# Patient Record
Sex: Male | Born: 1965 | Race: White | Hispanic: No | Marital: Single | Smoking: Never smoker
Health system: Southern US, Community
[De-identification: ages and names within clinical notes are randomized; demographics above are authoritative.]

## PROBLEM LIST (undated history)

## (undated) DIAGNOSIS — Z8739 Personal history of other diseases of the musculoskeletal system and connective tissue: Secondary | ICD-10-CM

## (undated) DIAGNOSIS — K229 Disease of esophagus, unspecified: Secondary | ICD-10-CM

## (undated) DIAGNOSIS — K219 Gastro-esophageal reflux disease without esophagitis: Secondary | ICD-10-CM

## (undated) DIAGNOSIS — E785 Hyperlipidemia, unspecified: Secondary | ICD-10-CM

## (undated) HISTORY — PX: KNEE ARTHROSCOPY W/ ACL RECONSTRUCTION: SHX1858

## (undated) HISTORY — PX: CHOLECYSTECTOMY: SHX55

---

## 2017-12-13 ENCOUNTER — Other Ambulatory Visit: Payer: Self-pay

## 2017-12-13 ENCOUNTER — Emergency Department (INDEPENDENT_AMBULATORY_CARE_PROVIDER_SITE_OTHER)
Admission: EM | Admit: 2017-12-13 | Discharge: 2017-12-13 | Disposition: A | Discharge status: Home / Self Care | Payer: PRIVATE HEALTH INSURANCE | Source: Home / Self Care | Attending: Family Medicine | Admitting: Family Medicine

## 2017-12-13 ENCOUNTER — Encounter: Payer: Self-pay | Admitting: Emergency Medicine

## 2017-12-13 DIAGNOSIS — R002 Palpitations: Secondary | ICD-10-CM

## 2017-12-13 DIAGNOSIS — E162 Hypoglycemia, unspecified: Secondary | ICD-10-CM | POA: Diagnosis not present

## 2017-12-13 DIAGNOSIS — R202 Paresthesia of skin: Secondary | ICD-10-CM

## 2017-12-13 HISTORY — DX: Gastro-esophageal reflux disease without esophagitis: K21.9

## 2017-12-13 HISTORY — DX: Personal history of other diseases of the musculoskeletal system and connective tissue: Z87.39

## 2017-12-13 HISTORY — DX: Hyperlipidemia, unspecified: E78.5

## 2017-12-13 HISTORY — DX: Disease of esophagus, unspecified: K22.9

## 2017-12-13 LAB — CBC WITH DIFFERENTIAL/PLATELET
Basophils Absolute: 33 cells/uL (ref 0–200)
Basophils Relative: 0.5 %
Eosinophils Absolute: 211 cells/uL (ref 15–500)
Eosinophils Relative: 3.2 %
HCT: 43.2 % (ref 38.5–50.0)
Hemoglobin: 14.8 g/dL (ref 13.2–17.1)
Lymphs Abs: 1228 cells/uL (ref 850–3900)
MCH: 31 pg (ref 27.0–33.0)
MCHC: 34.3 g/dL (ref 32.0–36.0)
MCV: 90.4 fL (ref 80.0–100.0)
MPV: 10.2 fL (ref 7.5–12.5)
Monocytes Relative: 7.4 %
Neutro Abs: 4640 cells/uL (ref 1500–7800)
Neutrophils Relative %: 70.3 %
Platelets: 274 10*3/uL (ref 140–400)
RBC: 4.78 10*6/uL (ref 4.20–5.80)
RDW: 12.4 % (ref 11.0–15.0)
Total Lymphocyte: 18.6 %
WBC mixed population: 488 cells/uL (ref 200–950)
WBC: 6.6 10*3/uL (ref 3.8–10.8)

## 2017-12-13 LAB — COMPLETE METABOLIC PANEL WITH GFR
AG Ratio: 2.1 (calc) (ref 1.0–2.5)
ALT: 22 U/L (ref 9–46)
AST: 14 U/L (ref 10–40)
Albumin: 4 g/dL (ref 3.6–5.1)
Alkaline phosphatase (APISO): 90 U/L (ref 40–115)
BUN: 15 mg/dL (ref 7–25)
CO2: 29 mmol/L (ref 20–32)
Calcium: 8.8 mg/dL (ref 8.6–10.3)
Chloride: 108 mmol/L (ref 98–110)
Creat: 1.03 mg/dL (ref 0.60–1.35)
GFR, Est African American: 98 mL/min/{1.73_m2} (ref >=60)
GFR, Est Non African American: 85 mL/min/{1.73_m2} (ref >=60)
Globulin: 1.9 g/dL (calc) (ref 1.9–3.7)
Glucose, Bld: 100 mg/dL — ABNORMAL HIGH (ref 65–99)
Potassium: 4.4 mmol/L (ref 3.5–5.3)
Sodium: 141 mmol/L (ref 135–146)
Total Bilirubin: 0.3 mg/dL (ref 0.2–1.2)
Total Protein: 5.9 g/dL — ABNORMAL LOW (ref 6.1–8.1)

## 2017-12-13 LAB — POCT FASTING CBG KUC MANUAL ENTRY: POCT Glucose (KUC): 107 mg/dL — AB (ref 70–99)

## 2017-12-13 NOTE — ED Provider Notes (Signed)
[facility]    CSN: [id number] Arrival date & time: 12/13/17  1509     History   Chief Complaint Chief Complaint  Patient presents with  . Tachycardia  . Tingling    lips  . Hypoglycemia    per [employer] [facility]    HPI James Martinez is a 52 y.o. male.   HPI  James Martinez is a 52 y.o. male presenting to UC with c/o episodes of lips tingling and burning with his heart racing and blurry vision over the last 1 month.  Today, he was at a health fair when his symptoms started so he had his blood sugar checked, it was 55.  He ate a large lunch and his symptoms resolved. He then went home to do some yardwork and had some tingling of his lips again.  He has hx of high cholesterol, dx 2 years ago during his last physical but was not started on medication at that time. No personal hx of thyroid disease or diabetes but pt states both run in his family.  He is currently looking for a new PCP but wanted to be evaluated today due to his low blood sugar reading.     Past Medical History:  Diagnosis Date  . Esophageal abnormality    spasm  . GERD (gastroesophageal reflux disease)   . History of knee problem   . Hyperlipidemia     There are no active problems to display for this patient.   Past Surgical History:  Procedure Laterality Date  . KNEE ARTHROSCOPY W/ ACL RECONSTRUCTION         Home Medications    Prior to Admission medications   Medication Sig Start Date End Date Taking? Authorizing Provider  aspirin 81 MG chewable tablet Chew by mouth daily.   Yes [provider]  omeprazole (PRILOSEC) 10 MG capsule Take 10 mg by mouth daily.   Yes [provider]    Family History No family history on file.  Social History Social History   Tobacco Use  . Smoking status: Never Smoker  . Smokeless tobacco: Never Used  Substance Use Topics  . Alcohol use: Never    Frequency: Never  . Drug use: Not on file     Allergies     Penicillins   Review of Systems Review of Systems  Constitutional: Negative for chills, diaphoresis and fever.  HENT: Negative for congestion, ear pain, sore throat, trouble swallowing and voice change.   Respiratory: Negative for cough and shortness of breath.   Cardiovascular: Positive for palpitations. Negative for chest pain.  Gastrointestinal: Negative for abdominal pain, diarrhea, nausea and vomiting.  Musculoskeletal: Negative for arthralgias, back pain and myalgias.  Skin: Negative for rash.  Neurological: Positive for numbness (tinginglin in lips).     Physical Exam Triage Vital Signs ED Triage Vitals [12/13/17 1554]  Enc Vitals Group     BP 117/75     Pulse Rate 70     Resp 18     Temp 98.8 F (37.1 C)     Temp Source Oral     SpO2 96 %     Weight 232 lb (105.2 kg)     Height  (1.676 m)     Head Circumference      Peak Flow      Pain Score 0     Pain Loc      Pain Edu?      Excl. in GC?  No data found.  Updated Vital Signs BP 117/75 (BP Location: Right Arm)   Pulse 70   Temp 98.8 F (37.1 C) (Oral)   Resp 18   Ht  (1.676 m)   Wt 232 lb (105.2 kg)   SpO2 96%   BMI 37.45 kg/m   Visual Acuity Right Eye Distance:   Left Eye Distance:   Bilateral Distance:    Right Eye Near:   Left Eye Near:    Bilateral Near:     Physical Exam  Constitutional: He is oriented to person, place, and time. He appears well-developed and well-nourished. No distress.  HENT:  Head: Normocephalic and atraumatic.  Mouth/Throat: Oropharynx is clear and moist.  Eyes: Pupils are equal, round, and reactive to light. Conjunctivae and EOM are normal.  Neck: Normal range of motion. Neck supple.  Cardiovascular: Normal rate and regular rhythm.  Pulmonary/Chest: Effort normal and breath sounds normal. No stridor. No respiratory distress. He has no wheezes. He has no rales.  Musculoskeletal: Normal range of motion.  Neurological: He is alert and oriented to person,  place, and time. No cranial nerve deficit. Coordination normal.  Skin: Skin is warm and dry. Capillary refill takes less than 2 seconds. No rash noted. He is not diaphoretic.  Psychiatric: He has a normal mood and affect. His behavior is normal.  Nursing note and vitals reviewed.    UC Treatments / Results  Labs (all labs ordered are listed, but only abnormal results are displayed) Labs Reviewed  POCT FASTING CBG [facility] MANUAL ENTRY - Abnormal; Notable for the following components:      Result Value   POCT Glucose ([facility]) 107 (*)    All other components within normal limits  COMPLETE METABOLIC PANEL WITH GFR  TSH  HEMOGLOBIN A1C  CBC WITH DIFFERENTIAL/PLATELET    EKG Date/Time:12/13/2017   15:40:28 Ventricular Rate: 63 PR Interval: 164 QRS Duration: 106 QT Interval: 400 QTC Calculation: 409 P-R-T axes: 42  25   28 Text Interpretation: Normal sinus rhythm, normal EKG    Radiology No results found.  Procedures Procedures (including critical care time)  Medications Ordered in UC Medications - No data to display   Initial Impression / Assessment and Plan / UC Course  I have reviewed the triage vital signs and the nursing notes.  Pertinent labs & imaging results that were available during my care of the patient were reviewed by me and considered in my medical decision making (see chart for details).     Reassured pt of normal EKG. CBG: 107 Symptoms likely due to pt's low blood glucose at that time, however, recommend pt re-establish care with a PCP for a full physical. Will start labs today: CBC, CMP, TSH, and HgbA1c Home care instructions provided  Discussed symptoms that warrant emergent care in the ED.   Final Clinical Impressions(s) / UC Diagnoses   Final diagnoses:  Hypoglycemia  Palpitations  Paresthesia    ED Discharge Orders    None       Controlled Substance Prescriptions [place] Controlled Substance Registry consulted? Not Applicable     Rolla Plate 12/13/17 1654

## 2017-12-13 NOTE — ED Triage Notes (Signed)
Patient reports episodes of racing heart, tingling of lips, blurred vision over past month; today had these symptoms and his BG at a health fair was 55; he had breakfast 2 hours prior to test.

## 2017-12-15 ENCOUNTER — Telehealth: Payer: Self-pay | Admitting: Emergency Medicine

## 2017-12-15 LAB — TSH: TSH: 0.53 mIU/L (ref 0.40–4.50)

## 2017-12-15 LAB — HEMOGLOBIN A1C
Hgb A1c MFr Bld: 5.1 % of total Hgb (ref <5.7)
Mean Plasma Glucose: 100 (calc)
eAG (mmol/L): 5.5 (calc)

## 2018-03-04 ENCOUNTER — Other Ambulatory Visit: Payer: Self-pay

## 2018-03-04 ENCOUNTER — Emergency Department
Admission: EM | Admit: 2018-03-04 | Discharge: 2018-03-04 | Disposition: A | Discharge status: Home / Self Care | Payer: PRIVATE HEALTH INSURANCE | Source: Home / Self Care | Attending: Family Medicine | Admitting: Family Medicine

## 2018-03-04 ENCOUNTER — Encounter: Payer: Self-pay | Admitting: Emergency Medicine

## 2018-03-04 DIAGNOSIS — J069 Acute upper respiratory infection, unspecified: Secondary | ICD-10-CM | POA: Diagnosis not present

## 2018-03-04 DIAGNOSIS — B9789 Other viral agents as the cause of diseases classified elsewhere: Secondary | ICD-10-CM | POA: Diagnosis not present

## 2018-03-04 MED ORDER — BENZONATATE 100 MG PO CAPS: 100.0000 mg | ORAL_CAPSULE | Freq: Three times a day (TID) | ORAL | 0 refills | Status: DC

## 2018-03-04 MED ORDER — PREDNISONE 20 MG PO TABS: ORAL_TABLET | ORAL | 0 refills | Status: DC

## 2018-03-04 NOTE — ED Triage Notes (Signed)
52 y.o male c/o productive cough that began yesterday. States he's been taking Ibuprofen and Promethazine with no relief.

## 2018-03-04 NOTE — Discharge Instructions (Signed)
°  Please take the tessalon and prednisone as prescribed and continue to use your inhaler as needed.  You may also take over the counter cough medication such as plain Mucinex, Robitussin, or Delsym to help with cough.  Please follow up with family medicine in 1 week if not improving, sooner if significantly worsening.

## 2018-03-04 NOTE — ED Provider Notes (Signed)
[facility]    CSN: [id number] Arrival date & time: 03/04/18  0808     History   Chief Complaint Chief Complaint  Patient presents with  . Cough    HPI Rondel Ohewell L Jayson is a 52 y.o. male.   HPI  Rondel Ohewell L Pumphrey is a 52 y.o. male presenting to UC with c/o moderately productive cough that started yesterday with associated nausea with 1 episode of post-tussive vomiting and mild chest soreness with HA. He did take ibuprofen and promethazine with mild relief. He has not tried any cough medication. He does have a hx of asthma and has used his inhaler more frequently yesterday.  Denies SOB or chest tightness. Denies fever, chills, or diarrhea.  No known sick contacts or recent travel.    Past Medical History:  Diagnosis Date  . Esophageal abnormality    spasm  . GERD (gastroesophageal reflux disease)   . History of knee problem   . Hyperlipidemia     There are no active problems to display for this patient.   Past Surgical History:  Procedure Laterality Date  . KNEE ARTHROSCOPY W/ ACL RECONSTRUCTION         Home Medications    Prior to Admission medications   Medication Sig Start Date End Date Taking? Authorizing Provider  aspirin 81 MG chewable tablet Chew by mouth daily.    [provider]  benzonatate (TESSALON) 100 MG capsule Take 1-2 capsules (100-200 mg total) by mouth every 8 (eight) hours. 03/04/18   Lurene ShadowPhelps, Tajia Szeliga O, PA-C  omeprazole (PRILOSEC) 10 MG capsule Take 10 mg by mouth daily.    [provider]  predniSONE (DELTASONE) 20 MG tablet 3 tabs po day one, then 2 po daily x 4 days 03/04/18   Lurene ShadowPhelps, Alsie Younes O, PA-C    Family History Family History  Problem Relation Age of Onset  . Cancer Father   . Heart failure Father     Social History Social History   Tobacco Use  . Smoking status: Never Smoker  . Smokeless tobacco: Never Used  Substance Use Topics  . Alcohol use: Never    Frequency: Never  . Drug use: Never      Allergies   Penicillins   Review of Systems Review of Systems  Constitutional: Negative for chills and fever.  HENT: Positive for congestion. Negative for ear pain, sore throat, trouble swallowing and voice change.   Respiratory: Positive for cough. Negative for shortness of breath.   Cardiovascular: Positive for chest pain (sore from cough). Negative for palpitations.  Gastrointestinal: Negative for abdominal pain, diarrhea, nausea and vomiting.  Musculoskeletal: Negative for arthralgias, back pain and myalgias.  Skin: Negative for rash.  Neurological: Positive for headaches. Negative for dizziness and light-headedness.     Physical Exam Triage Vital Signs ED Triage Vitals  Enc Vitals Group     BP 03/04/18 0826 119/82     Pulse Rate 03/04/18 0826 63     Resp --      Temp 03/04/18 0826 98.4 F (36.9 C)     Temp Source 03/04/18 0826 Oral     SpO2 03/04/18 0826 99 %     Weight 03/04/18 0827 235 lb (106.6 kg)     Height 03/04/18 0827 5\' 6"  (1.676 m)     Head Circumference --      Peak Flow --      Pain Score 03/04/18 0827 0     Pain Loc --  Pain Edu? --      Excl. in GC? --    No data found.  Updated Vital Signs BP 119/82 (BP Location: Right Arm)   Pulse 63   Temp 98.4 F (36.9 C) (Oral)   Ht 5\' 6"  (1.676 m)   Wt 235 lb (106.6 kg)   SpO2 99%   BMI 37.93 kg/m   Visual Acuity Right Eye Distance:   Left Eye Distance:   Bilateral Distance:    Right Eye Near:   Left Eye Near:    Bilateral Near:     Physical Exam  Constitutional: He is oriented to person, place, and time. He appears well-developed and well-nourished. No distress.  HENT:  Head: Normocephalic and atraumatic.  Right Ear: Tympanic membrane normal.  Left Ear: Tympanic membrane normal.  Nose: Nose normal. Right sinus exhibits no maxillary sinus tenderness and no frontal sinus tenderness. Left sinus exhibits no maxillary sinus tenderness and no frontal sinus tenderness.  Mouth/Throat:  Uvula is midline, oropharynx is clear and moist and mucous membranes are normal.  Eyes: EOM are normal.  Neck: Normal range of motion. Neck supple.  Cardiovascular: Normal rate and regular rhythm.  Pulmonary/Chest: Effort normal and breath sounds normal. No stridor. No respiratory distress. He has no wheezes. He has no rales.  Intermittent dry cough during exam. No respiratory distress.   Musculoskeletal: Normal range of motion.  Lymphadenopathy:    He has no cervical adenopathy.  Neurological: He is alert and oriented to person, place, and time.  Skin: Skin is warm and dry. He is not diaphoretic.  Psychiatric: He has a normal mood and affect. His behavior is normal.  Nursing note and vitals reviewed.    UC Treatments / Results  Labs (all labs ordered are listed, but only abnormal results are displayed) Labs Reviewed - No data to display  EKG None  Radiology No results found.  Procedures Procedures (including critical care time)  Medications Ordered in UC Medications - No data to display  Initial Impression / Assessment and Plan / UC Course  I have reviewed the triage vital signs and the nursing notes.  Pertinent labs & imaging results that were available during my care of the patient were reviewed by me and considered in my medical decision making (see chart for details).     Hx and exam c/w viral URI. Pt requesting work note. Note provided, pt to go back Friday.   Final Clinical Impressions(s) / UC Diagnoses   Final diagnoses:  Viral URI with cough     Discharge Instructions      Please take the tessalon and prednisone as prescribed and continue to use your inhaler as needed.  You may also take over the counter cough medication such as plain Mucinex, Robitussin, or Delsym to help with cough.  Please follow up with family medicine in 1 week if not improving, sooner if significantly worsening.     ED Prescriptions    Medication Sig Dispense Auth. Provider    benzonatate (TESSALON) 100 MG capsule Take 1-2 capsules (100-200 mg total) by mouth every 8 (eight) hours. 21 capsule Doroteo Glassman, Lanasia Porras O, PA-C   predniSONE (DELTASONE) 20 MG tablet 3 tabs po day one, then 2 po daily x 4 days 11 tablet Lurene Shadow, PA-C     Controlled Substance Prescriptions [place] Controlled Substance Registry consulted? Not Applicable   Rolla Plate 03/04/18 1610

## 2018-04-16 ENCOUNTER — Encounter: Payer: Self-pay | Admitting: Family Medicine

## 2018-04-16 ENCOUNTER — Emergency Department
Admission: EM | Admit: 2018-04-16 | Discharge: 2018-04-16 | Disposition: A | Discharge status: Home / Self Care | Payer: PRIVATE HEALTH INSURANCE | Source: Home / Self Care

## 2018-04-16 DIAGNOSIS — L03311 Cellulitis of abdominal wall: Secondary | ICD-10-CM | POA: Diagnosis not present

## 2018-04-16 MED ORDER — DOXYCYCLINE HYCLATE 100 MG PO TABS: 100.0000 mg | ORAL_TABLET | Freq: Two times a day (BID) | ORAL | 0 refills | Status: DC

## 2018-04-16 NOTE — ED Triage Notes (Signed)
James Martinez complains of open sore with redness and swelling it comes and goes for the last month. He has applied anti-fungal and also tried antibiotic to the area without relief.

## 2018-04-16 NOTE — ED Provider Notes (Signed)
[facility]    CSN: [id number] Arrival date & time: 04/16/18  1726     History   Chief Complaint Chief Complaint  Patient presents with  . Wound Check    HPI James Martinez is a 52 y.o. male.   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  He has had some surrounding erythema that is migrating to the right lower quadrant.  There is mild tenderness.  Patient has had no fever or chronic skin breakdown.     Past Medical History:  Diagnosis Date  . Esophageal abnormality    spasm  . GERD (gastroesophageal reflux disease)   . History of knee problem   . Hyperlipidemia     There are no active problems to display for this patient.   Past Surgical History:  Procedure Laterality Date  . KNEE ARTHROSCOPY W/ ACL RECONSTRUCTION         Home Medications    Prior to Admission medications   Medication Sig Start Date End Date Taking? Authorizing Provider  aspirin 81 MG chewable tablet Chew by mouth daily.   Yes [provider]  omeprazole (PRILOSEC) 10 MG capsule Take 10 mg by mouth daily.   Yes [provider]  doxycycline (VIBRA-TABS) 100 MG tablet Take 1 tablet (100 mg total) by mouth 2 (two) times daily. 04/16/18   Elvina Sidle, MD    Family History Family History  Problem Relation Age of Onset  . Cancer Father   . Heart failure Father     Social History Social History   Tobacco Use  . Smoking status: Never Smoker  . Smokeless tobacco: Never Used  Substance Use Topics  . Alcohol use: Never    Frequency: Never  . Drug use: Never     Allergies   Penicillins and Prednisone   Review of Systems Review of Systems  Constitutional: Negative.   Skin: Positive for rash.     Physical Exam Triage Vital Signs ED Triage Vitals  Enc Vitals Group     BP 04/16/18 1744 122/86     Pulse Rate 04/16/18 1744 83     Resp --      Temp 04/16/18 1744  99 F (37.2 C)     Temp Source 04/16/18 1744 Oral     SpO2 04/16/18 1744 95 %     Weight 04/16/18 1746 238 lb (108 kg)     Height 04/16/18 1746 5\' 6"  (1.676 m)     Head Circumference --      Peak Flow --      Pain Score 04/16/18 1745 4     Pain Loc --      Pain Edu? --      Excl. in GC? --    No data found.  Updated Vital Signs BP 122/86 (BP Location: Right Arm)   Pulse 83   Temp 99 F (37.2 C) (Oral)   Ht 5\' 6"  (1.676 m)   Wt 108 kg   SpO2 95%   BMI 38.41 kg/m    Physical Exam  Constitutional: He appears well-developed and well-nourished.  HENT:  Head: Normocephalic.  Eyes: Pupils are equal, round, and reactive to light. Conjunctivae are normal.  Neck: Normal range of motion. Neck supple.  Pulmonary/Chest: Effort normal.  Abdominal: Soft.  Skin: There is erythema.  3 mm ulceration at the lower margin of the surgically removed umbilicus area, 5 cm of pink erythema  extending from the ulcer area to the right lower quadrant.  There is no induration.  Nursing note and vitals reviewed.    UC Treatments / Results  Labs (all labs ordered are listed, but only abnormal results are displayed) Labs Reviewed - No data to display  EKG None  Radiology No results found.  Procedures Procedures (including critical care time)  Medications Ordered in UC Medications - No data to display  Initial Impression / Assessment and Plan / UC Course  I have reviewed the triage vital signs and the nursing notes.  Pertinent labs & imaging results that were available during my care of the patient were reviewed by me and considered in my medical decision making (see chart for details).     Final Clinical Impressions(s) / UC Diagnoses   Final diagnoses:  Cellulitis of abdominal wall   Discharge Instructions   None    ED Prescriptions    Medication Sig Dispense Auth. Provider   doxycycline (VIBRA-TABS) 100 MG tablet Take 1 tablet (100 mg total) by mouth 2 (two) times daily. 20  tablet Elvina SidleLauenstein, Coben Godshall, MD     Controlled Substance Prescriptions [place] Controlled Substance Registry consulted? Not Applicable   Elvina SidleLauenstein, Raman Featherston, MD 04/16/18 [phone]

## 2018-04-18 ENCOUNTER — Telehealth: Payer: Self-pay

## 2018-04-18 NOTE — Telephone Encounter (Signed)
Pt states the wound is still pretty red. Advised to return to UC for f/u if not improved towards the end of the week. Pt agrees.

## 2018-06-27 ENCOUNTER — Other Ambulatory Visit: Payer: Self-pay

## 2018-06-27 ENCOUNTER — Emergency Department (INDEPENDENT_AMBULATORY_CARE_PROVIDER_SITE_OTHER): Payer: PRIVATE HEALTH INSURANCE

## 2018-06-27 ENCOUNTER — Emergency Department
Admission: EM | Admit: 2018-06-27 | Discharge: 2018-06-27 | Disposition: A | Discharge status: Home / Self Care | Payer: PRIVATE HEALTH INSURANCE | Source: Home / Self Care | Attending: Family Medicine | Admitting: Family Medicine

## 2018-06-27 DIAGNOSIS — M940 Chondrocostal junction syndrome [Tietze]: Secondary | ICD-10-CM | POA: Diagnosis not present

## 2018-06-27 DIAGNOSIS — M4802 Spinal stenosis, cervical region: Secondary | ICD-10-CM | POA: Diagnosis not present

## 2018-06-27 DIAGNOSIS — M50322 Other cervical disc degeneration at C5-C6 level: Secondary | ICD-10-CM | POA: Diagnosis not present

## 2018-06-27 DIAGNOSIS — M5412 Radiculopathy, cervical region: Secondary | ICD-10-CM | POA: Diagnosis not present

## 2018-06-27 DIAGNOSIS — R079 Chest pain, unspecified: Secondary | ICD-10-CM | POA: Diagnosis not present

## 2018-06-27 MED ORDER — METHYLPREDNISOLONE ACETATE 80 MG/ML IJ SUSP
80.0000 mg | Freq: Once | INTRAMUSCULAR | Status: AC
  Administered 2018-06-27: 80 mg via INTRAMUSCULAR

## 2018-06-27 MED ORDER — HYDROCODONE-ACETAMINOPHEN 5-325 MG PO TABS: ORAL_TABLET | ORAL | 0 refills | Status: DC

## 2018-06-27 NOTE — Discharge Instructions (Addendum)
Apply ice pack to back of neck and over sternum for 20 to 30 minutes, 3 to 4 times daily  Continue until pain decreases.

## 2018-06-27 NOTE — ED Triage Notes (Signed)
Pt c/o LT sided chest pain that radiates down left arm since last night. Denies injury. Says he lifts a lot at work. Wanted to r/o heart.

## 2018-06-27 NOTE — ED Provider Notes (Signed)
[facility]    CSN: [id number] Arrival date & time: 06/27/18  1143     History   Chief Complaint Chief Complaint  Patient presents with  . Chest Pain    shooting down LT arm    HPI James Martinez is a 52 y.o. male.   Patient complains of intermittent squeezing pain in his left chest that started last night.  He does a lot of heavy liftng at work and is concerned about his heart.  The pain radiates into his left arm down to his middle finger.  He denies shortness of breath or nausea.  The pain in his left arm is worse when he flexes his neck laterally to the left.  Rotating his head to the left causes tightness in his upper back.  He recalls no injury.  The history is provided by the patient.    Past Medical History:  Diagnosis Date  . Esophageal abnormality    spasm  . GERD (gastroesophageal reflux disease)   . History of knee problem   . Hyperlipidemia       Past Surgical History:  Procedure Laterality Date  . KNEE ARTHROSCOPY W/ ACL RECONSTRUCTION         Home Medications    Prior to Admission medications   Medication Sig Start Date End Date Taking? Authorizing Provider  aspirin 81 MG chewable tablet Chew by mouth daily.    [provider]  doxycycline (VIBRA-TABS) 100 MG tablet Take 1 tablet (100 mg total) by mouth 2 (two) times daily. 04/16/18   Elvina Sidle, MD  HYDROcodone-acetaminophen (NORCO/VICODIN) 5-325 MG tablet Take one by mouth at bedtime as needed for pain.  May repeat 4 to 6 hours later if needed. 06/27/18   Lattie Haw, MD  omeprazole (PRILOSEC) 10 MG capsule Take 10 mg by mouth daily.    [provider]    Family History Family History  Problem Relation Age of Onset  . Cancer Father   . Heart failure Father     Social History Social History   Tobacco Use  . Smoking status: Never Smoker  . Smokeless tobacco: Never Used  Substance Use Topics  . Alcohol use: Never    Frequency: Never  . Drug  use: Never     Allergies   Penicillins and Prednisone   Review of Systems Review of Systems  Constitutional: Negative for activity change, chills, diaphoresis, fatigue and fever.  HENT: Negative.   Eyes: Negative.   Respiratory: Positive for chest tightness. Negative for cough, shortness of breath, wheezing and stridor.   Cardiovascular: Positive for chest pain. Negative for palpitations and leg swelling.  Gastrointestinal: Negative.   Genitourinary: Negative.   Musculoskeletal: Positive for back pain, neck pain and neck stiffness. Negative for myalgias.  Skin: Negative.   Neurological: Negative for headaches.     Physical Exam Triage Vital Signs ED Triage Vitals  Enc Vitals Group     BP 06/27/18 1330 118/84     Pulse Rate 06/27/18 1330 71     Resp 06/27/18 1330 18     Temp 06/27/18 1330 98.3 F (36.8 C)     Temp Source 06/27/18 1330 Oral     SpO2 06/27/18 1330 96 %     Weight 06/27/18 1331 243 lb (110.2 kg)     Height 06/27/18 1331 5\' 6"  (1.676 m)     Head Circumference --      Peak Flow --      Pain Score  06/27/18 1330 6     Pain Loc --      Pain Edu? --      Excl. in GC? --    No data found.  Updated Vital Signs BP 118/84 (BP Location: Right Arm)   Pulse 71   Temp 98.3 F (36.8 C) (Oral)   Resp 18   Ht 5\' 6"  (1.676 m)   Wt 110.2 kg   SpO2 96%   BMI 39.22 kg/m   Visual Acuity Right Eye Distance:   Left Eye Distance:   Bilateral Distance:    Right Eye Near:   Left Eye Near:    Bilateral Near:     Physical Exam  Constitutional: He appears well-developed and well-nourished. No distress.  HENT:  Head: Normocephalic.  Right Ear: External ear normal.  Left Ear: External ear normal.  Nose: Nose normal.  Mouth/Throat: Oropharynx is clear and moist.  Eyes: Pupils are equal, round, and reactive to light.  Neck: Neck supple.  Cardiovascular: Normal heart sounds.  Pulmonary/Chest: Breath sounds normal.      There is tenderness to palpation over  the trapezius muscles as noted on diagram.   There is distinct tenderness to palpation over the mid-sternum as noted on diagram.     Abdominal: There is no tenderness.  Musculoskeletal: He exhibits no edema.  Lymphadenopathy:    He has no cervical adenopathy.  Neurological: He is alert. He has normal strength. No sensory deficit.  Skin: Skin is warm and dry. No rash noted.  Nursing note and vitals reviewed.    UC Treatments / Results  Labs (all labs ordered are listed, but only abnormal results are displayed) Labs Reviewed - No data to display  EKG  Rate:  63 BPM PR:  160 msec QT:  388 msec QTcH:  397 msec QRSD:  98 msec QRS axis:  28 degrees Interpretation:  within normal limits.  No acute changes   Radiology Dg Chest 2 View  Result Date: 06/27/2018 CLINICAL DATA:  Chest pain. EXAM: CHEST - 2 VIEW COMPARISON:  None. FINDINGS: The heart size and mediastinal contours are within normal limits. Both lungs are clear. No pneumothorax or pleural effusion is noted. The visualized skeletal structures are unremarkable. IMPRESSION: No active cardiopulmonary disease. Electronically Signed   By: Lupita Raider, M.D.   On: 06/27/2018 14:56   Dg Cervical Spine Complete  Result Date: 06/27/2018 CLINICAL DATA:  Chronic neck pain without injury. Bilateral radiculopathy. EXAM: CERVICAL SPINE - COMPLETE 4+ VIEW COMPARISON:  None. FINDINGS: No fracture or spondylolisthesis is noted. Moderate degenerative disc disease is noted at C5-6 and C6-7. Mild bilateral neural foraminal stenosis is noted at C5-6 and C6-7 secondary to uncovertebral spurring. IMPRESSION: Moderate degenerative disc disease is noted at C5-6 and C6-7 with mild bilateral neural foraminal stenosis at these levels secondary to uncovertebral spurring. No acute abnormality is noted in the cervical spine. Electronically Signed   By: Lupita Raider, M.D.   On: 06/27/2018 14:55    Procedures Procedures (including critical care  time)  Medications Ordered in UC Medications  methylPREDNISolone acetate (DEPO-MEDROL) injection 80 mg (has no administration in time range)    Initial Impression / Assessment and Plan / UC Course  I have reviewed the triage vital signs and the nursing notes.  Pertinent labs & imaging results that were available during my care of the patient were reviewed by me and considered in my medical decision making (see chart for details).    Administered Depo  Medrol 80mg  IM.  Rx for Lortab at bedtime prn. Controlled Substance Prescriptions I have consulted the [place] Controlled Substances Registry for this patient, and feel the risk/benefit ratio today is favorable for proceeding with this prescription for a controlled substance.   Followup with Dr. Rodney Langton or Dr. Clementeen [facility] ([facility]) for management.   Final Clinical Impressions(s) / UC Diagnoses   Final diagnoses:  Cervical radiculopathy  Costochondritis     Discharge Instructions     Apply ice pack to back of neck and over sternum for 20 to 30 minutes, 3 to 4 times daily  Continue until pain decreases.    ED Prescriptions    Medication Sig Dispense Auth. Provider   HYDROcodone-acetaminophen (NORCO/VICODIN) 5-325 MG tablet Take one by mouth at bedtime as needed for pain.  May repeat 4 to 6 hours later if needed. 10 tablet Lattie Haw, MD        Lattie Haw, MD 06/30/18 2104

## 2018-07-03 ENCOUNTER — Encounter: Payer: Self-pay | Admitting: Emergency Medicine

## 2018-07-03 ENCOUNTER — Emergency Department
Admission: EM | Admit: 2018-07-03 | Discharge: 2018-07-03 | Disposition: A | Discharge status: Home / Self Care | Payer: PRIVATE HEALTH INSURANCE | Source: Home / Self Care | Attending: Family Medicine | Admitting: Family Medicine

## 2018-07-03 ENCOUNTER — Other Ambulatory Visit: Payer: Self-pay

## 2018-07-03 ENCOUNTER — Emergency Department (INDEPENDENT_AMBULATORY_CARE_PROVIDER_SITE_OTHER): Payer: PRIVATE HEALTH INSURANCE

## 2018-07-03 DIAGNOSIS — J181 Lobar pneumonia, unspecified organism: Secondary | ICD-10-CM | POA: Diagnosis not present

## 2018-07-03 DIAGNOSIS — J189 Pneumonia, unspecified organism: Secondary | ICD-10-CM

## 2018-07-03 DIAGNOSIS — J9811 Atelectasis: Secondary | ICD-10-CM

## 2018-07-03 DIAGNOSIS — R0789 Other chest pain: Secondary | ICD-10-CM

## 2018-07-03 MED ORDER — ALBUTEROL SULFATE HFA 108 (90 BASE) MCG/ACT IN AERS: 1.0000 | INHALATION_SPRAY | Freq: Four times a day (QID) | RESPIRATORY_TRACT | 0 refills | Status: AC | PRN

## 2018-07-03 MED ORDER — BENZONATATE 100 MG PO CAPS: 100.0000 mg | ORAL_CAPSULE | Freq: Three times a day (TID) | ORAL | 0 refills | Status: DC

## 2018-07-03 MED ORDER — METHYLPREDNISOLONE SODIUM SUCC 40 MG IJ SOLR
80.0000 mg | Freq: Once | INTRAMUSCULAR | Status: AC
  Administered 2018-07-03: 80 mg via INTRAMUSCULAR

## 2018-07-03 MED ORDER — DOXYCYCLINE HYCLATE 100 MG PO CAPS: 100.0000 mg | ORAL_CAPSULE | Freq: Two times a day (BID) | ORAL | 0 refills | Status: DC

## 2018-07-03 NOTE — ED Triage Notes (Signed)
Patient was here 6 days ago for chest pain; that has cleared but now he has nasal and chest congestion; denies fever; took ASA last night for symptoms.

## 2018-07-03 NOTE — ED Provider Notes (Signed)
[facility]    CSN: [id number] Arrival date & time: 07/03/18  0940     History   Chief Complaint Chief Complaint  Patient presents with  . Nasal Congestion  . Cough    HPI TEREK BEE is a 52 y.o. male.   HPI TRAMAYNE SEBESTA is a 52 y.o. male presenting to UC with c/o chest pain that started 6 days ago, aching and sore, gradually improving but he is concerned because he has developed nasal and chest congestion about 2 days ago, gradually worsening. Chest tightness and SOB last night. Hx of asthma but he ran out of his albuterol inhaler. Hx of pneumonia in the past.  He took aspirin last night with mild relief. Denies fever, chills, n/v/d.    Past Medical History:  Diagnosis Date  . Esophageal abnormality    spasm  . GERD (gastroesophageal reflux disease)   . History of knee problem   . Hyperlipidemia     There are no active problems to display for this patient.   Past Surgical History:  Procedure Laterality Date  . KNEE ARTHROSCOPY W/ ACL RECONSTRUCTION         Home Medications    Prior to Admission medications   Medication Sig Start Date End Date Taking? Authorizing Provider  albuterol (PROVENTIL HFA;VENTOLIN HFA) 108 (90 Base) MCG/ACT inhaler Inhale 1-2 puffs into the lungs every 6 (six) hours as needed for wheezing or shortness of breath. 07/03/18   Lurene Shadow, PA-C  aspirin 81 MG chewable tablet Chew by mouth daily.    [provider]  benzonatate (TESSALON) 100 MG capsule Take 1-2 capsules (100-200 mg total) by mouth every 8 (eight) hours. 07/03/18   Lurene Shadow, PA-C  doxycycline (VIBRAMYCIN) 100 MG capsule Take 1 capsule (100 mg total) by mouth 2 (two) times daily for 10 days. 07/03/18 07/13/18  Lurene Shadow, PA-C  HYDROcodone-acetaminophen (NORCO/VICODIN) 5-325 MG tablet Take one by mouth at bedtime as needed for pain.  May repeat 4 to 6 hours later if needed. 06/27/18   Lattie Haw, MD  omeprazole (PRILOSEC) 10  MG capsule Take 10 mg by mouth daily.    [provider]    Family History Family History  Problem Relation Age of Onset  . Cancer Father   . Heart failure Father     Social History Social History   Tobacco Use  . Smoking status: Never Smoker  . Smokeless tobacco: Never Used  Substance Use Topics  . Alcohol use: Never    Frequency: Never  . Drug use: Never     Allergies   Penicillins and Prednisone   Review of Systems Review of Systems  Constitutional: Negative for chills and fever.  HENT: Positive for congestion, postnasal drip and sore throat. Negative for ear pain, trouble swallowing and voice change.   Respiratory: Positive for cough and chest tightness. Negative for shortness of breath.   Cardiovascular: Positive for chest pain. Negative for palpitations.  Gastrointestinal: Negative for abdominal pain, diarrhea, nausea and vomiting.  Musculoskeletal: Negative for arthralgias, back pain and myalgias.  Skin: Negative for rash.  Neurological: Negative for dizziness, light-headedness and headaches.     Physical Exam Triage Vital Signs ED Triage Vitals [07/03/18 0959]  Enc Vitals Group     BP 114/76     Pulse Rate 77     Resp 16     Temp 98.1 F (36.7 C)     Temp Source Oral  SpO2 95 %     Weight      Height      Head Circumference      Peak Flow      Pain Score 0     Pain Loc      Pain Edu?      Excl. in GC?    No data found.  Updated Vital Signs BP 114/76 (BP Location: Right Arm)   Pulse 77   Temp 98.1 F (36.7 C) (Oral)   Resp 16   SpO2 95%   Visual Acuity Right Eye Distance:   Left Eye Distance:   Bilateral Distance:    Right Eye Near:   Left Eye Near:    Bilateral Near:     Physical Exam  Constitutional: He is oriented to person, place, and time. He appears well-developed and well-nourished. No distress.  HENT:  Head: Normocephalic and atraumatic.  Right Ear: Tympanic membrane normal.  Left Ear: Tympanic membrane  normal.  Nose: Nose normal.  Mouth/Throat: Uvula is midline, oropharynx is clear and moist and mucous membranes are normal.  Eyes: EOM are normal.  Neck: Normal range of motion. Neck supple.  Cardiovascular: Normal rate and regular rhythm.  Pulmonary/Chest: Effort normal. No stridor. No respiratory distress. He has wheezes (faint, diffuse). He has no rales.  Musculoskeletal: Normal range of motion.  Lymphadenopathy:    He has no cervical adenopathy.  Neurological: He is alert and oriented to person, place, and time.  Skin: Skin is warm and dry. He is not diaphoretic.  Psychiatric: He has a normal mood and affect. His behavior is normal.  Nursing note and vitals reviewed.    UC Treatments / Results  Labs (all labs ordered are listed, but only abnormal results are displayed) Labs Reviewed - No data to display  EKG None  Radiology Dg Chest 2 View  Result Date: 07/03/2018 CLINICAL DATA:  C/o cough and chest tightness x 2 days. EXAM: CHEST - 2 VIEW COMPARISON:  06/27/2018 FINDINGS: Heart size is normal. There is minimal LEFT LOWER lobe atelectasis. There are no focal consolidations or pleural effusions. No pulmonary edema. IMPRESSION: Subsegmental atelectasis in the LEFT LOWER lobe.  Otherwise, normal. Electronically Signed   By: Norva Pavlov M.D.   On: 07/03/2018 10:30    Procedures Procedures (including critical care time)  Medications Ordered in UC Medications  methylPREDNISolone sodium succinate (SOLU-MEDROL) 40 mg/mL injection 80 mg (80 mg Intramuscular Given 07/03/18 1048)    Initial Impression / Assessment and Plan / UC Course  I have reviewed the triage vital signs and the nursing notes.  Pertinent labs & imaging results that were available during my care of the patient were reviewed by me and considered in my medical decision making (see chart for details).     CXR concerning for possible early pneumonia, will start pt on doxycycline, pt states he does not do  well with azithromycin and is allergic to PCN. Will refill albuterol inhaler Home care instructions provided.  Final Clinical Impressions(s) / UC Diagnoses   Final diagnoses:  Community acquired pneumonia of left lower lobe of lung (HCC)  Chest tightness     Discharge Instructions      Please take antibiotics as prescribed and be sure to complete entire course even if you start to feel better to ensure infection does not come back.  Please follow up with family medicine in 1 week if not improving.     ED Prescriptions    Medication Sig Dispense  Auth. Provider   albuterol (PROVENTIL HFA;VENTOLIN HFA) 108 (90 Base) MCG/ACT inhaler Inhale 1-2 puffs into the lungs every 6 (six) hours as needed for wheezing or shortness of breath. 1 Inhaler Waylan RocherPhelps, Jamare Vanatta O, PA-C   doxycycline (VIBRAMYCIN) 100 MG capsule Take 1 capsule (100 mg total) by mouth 2 (two) times daily for 10 days. 20 capsule Doroteo GlassmanPhelps, Deysha Cartier O, PA-C   benzonatate (TESSALON) 100 MG capsule Take 1-2 capsules (100-200 mg total) by mouth every 8 (eight) hours. 21 capsule Lurene ShadowPhelps, Ermagene Saidi O, PA-C     Controlled Substance Prescriptions [place] Controlled Substance Registry consulted? Not Applicable   Rolla Platehelps, Chelcee Korpi O, PA-C 07/03/18 1547

## 2018-07-03 NOTE — Discharge Instructions (Signed)
°  Please take antibiotics as prescribed and be sure to complete entire course even if you start to feel better to ensure infection does not come back. ° °Please follow up with family medicine in 1 week if not improving.  °

## 2018-07-07 ENCOUNTER — Other Ambulatory Visit: Payer: Self-pay

## 2018-07-07 ENCOUNTER — Encounter: Payer: Self-pay | Admitting: *Deleted

## 2018-07-07 ENCOUNTER — Emergency Department
Admission: EM | Admit: 2018-07-07 | Discharge: 2018-07-07 | Disposition: A | Discharge status: Home / Self Care | Payer: PRIVATE HEALTH INSURANCE | Source: Home / Self Care

## 2018-07-07 DIAGNOSIS — J40 Bronchitis, not specified as acute or chronic: Secondary | ICD-10-CM | POA: Diagnosis not present

## 2018-07-07 MED ORDER — LEVOFLOXACIN 500 MG PO TABS: 500.0000 mg | ORAL_TABLET | Freq: Every day | ORAL | 0 refills | Status: DC

## 2018-07-07 MED ORDER — HYDROCOD POLST-CPM POLST ER 10-8 MG/5ML PO SUER: 5.0000 mL | Freq: Two times a day (BID) | ORAL | 0 refills | Status: DC | PRN

## 2018-07-07 MED ORDER — PREDNISONE 50 MG PO TABS: ORAL_TABLET | ORAL | 0 refills | Status: DC

## 2018-07-07 NOTE — ED Provider Notes (Signed)
[facility]    CSN: [id number] Arrival date & time: 07/07/18  0809     History   Chief Complaint Chief Complaint  Patient presents with  . Cough    HPI James Martinez is a 52 y.o. male.   This is a 52 year old man who is here on 1115 and diagnosed with pneumonia.  He was started on Vibramycin, Tessalon, and an inhaler because he has a history of asthma.  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  Since he started his medicine, patient has not been able to sleep and his cough is constant.  He is feeling somewhat tight in his chest as well.  Is having some mild shortness of breath on exertion.     Past Medical History:  Diagnosis Date  . Esophageal abnormality    spasm  . GERD (gastroesophageal reflux disease)   . History of knee problem   . Hyperlipidemia     There are no active problems to display for this patient.   Past Surgical History:  Procedure Laterality Date  . KNEE ARTHROSCOPY W/ ACL RECONSTRUCTION         Home Medications    Prior to Admission medications   Medication Sig Start Date End Date Taking? Authorizing Provider  albuterol (PROVENTIL HFA;VENTOLIN HFA) 108 (90 Base) MCG/ACT inhaler Inhale 1-2 puffs into the lungs every 6 (six) hours as needed for wheezing or shortness of breath. 07/03/18   Lurene Shadow, PA-C  aspirin 81 MG chewable tablet Chew by mouth daily.    [provider]  chlorpheniramine-HYDROcodone (TUSSIONEX PENNKINETIC ER) 10-8 MG/5ML SUER Take 5 mLs by mouth every 12 (twelve) hours as needed for cough. 07/07/18   Elvina Sidle, MD  levofloxacin (LEVAQUIN) 500 MG tablet Take 1 tablet (500 mg total) by mouth daily. 07/07/18   Elvina Sidle, MD  omeprazole (PRILOSEC) 10 MG capsule Take 10 mg by mouth daily.    [provider]  predniSONE (DELTASONE) 50 MG tablet 1 tablet daily with food 07/07/18   Elvina Sidle, MD    Family History Family History  Problem Relation  Age of Onset  . Cancer Father   . Heart failure Father     Social History Social History   Tobacco Use  . Smoking status: Never Smoker  . Smokeless tobacco: Never Used  Substance Use Topics  . Alcohol use: Never    Frequency: Never  . Drug use: Never     Allergies   Penicillins and Prednisone   Review of Systems Review of Systems  Constitutional: Positive for fatigue.  HENT: Negative.   Respiratory: Positive for cough, shortness of breath and wheezing.   Gastrointestinal: Negative.   All other systems reviewed and are negative.    Physical Exam Triage Vital Signs ED Triage Vitals  Enc Vitals Group     BP 07/07/18 0831 123/85     Pulse Rate 07/07/18 0831 76     Resp 07/07/18 0831 18     Temp 07/07/18 0831 98.1 F (36.7 C)     Temp Source 07/07/18 0831 Oral     SpO2 07/07/18 0831 97 %     Weight 07/07/18 0832 242 lb (109.8 kg)     Height 07/07/18 0832 5\' 6"  (1.676 m)     Head Circumference --      Peak Flow --      Pain Score 07/07/18 0832 0     Pain Loc --  Pain Edu? --      Excl. in GC? --    No data found.  Updated Vital Signs BP 123/85 (BP Location: Right Arm)   Pulse 76   Temp 98.1 F (36.7 C) (Oral)   Resp 18   Ht 5\' 6"  (1.676 m)   Wt 109.8 kg   SpO2 97%   BMI 39.06 kg/m    Physical Exam  Constitutional: He is oriented to person, place, and time. He appears well-developed and well-nourished.  HENT:  Head: Normocephalic.  Right Ear: External ear normal.  Left Ear: External ear normal.  Mouth/Throat: Oropharynx is clear and moist.  Normal tympanic membranes  Eyes: Pupils are equal, round, and reactive to light. Conjunctivae are normal.  Neck: Normal range of motion. Neck supple.  Cardiovascular: Normal rate, regular rhythm and normal heart sounds.  Pulmonary/Chest: Effort normal and breath sounds normal.  Musculoskeletal: Normal range of motion.  Lymphadenopathy:    He has no cervical adenopathy.  Neurological: He is alert and  oriented to person, place, and time.  Skin: Skin is warm and dry.  Psychiatric: He has a normal mood and affect.  Nursing note and vitals reviewed.    UC Treatments / Results  Labs (all labs ordered are listed, but only abnormal results are displayed) Labs Reviewed - No data to display  EKG None  Radiology No results found.  Procedures Procedures (including critical care time)  Medications Ordered in UC Medications - No data to display  Initial Impression / Assessment and Plan / UC Course  I have reviewed the triage vital signs and the nursing notes.  Pertinent labs & imaging results that were available during my care of the patient were reviewed by me and considered in my medical decision making (see chart for details).    Final Clinical Impressions(s) / UC Diagnoses   Final diagnoses:  Bronchitis     Discharge Instructions     I do not hear sounds of pneumonia today.  Instead, you appear to have bronchitis and the new medicine should work a little better.  Stop taking the doxycycline and start the new antibiotic along with prednisone.    ED Prescriptions    Medication Sig Dispense Auth. Provider   predniSONE (DELTASONE) 50 MG tablet 1 tablet daily with food 5 tablet Elvina SidleLauenstein, Hadyn Blanck, MD   chlorpheniramine-HYDROcodone (TUSSIONEX PENNKINETIC ER) 10-8 MG/5ML SUER Take 5 mLs by mouth every 12 (twelve) hours as needed for cough. 140 mL Elvina SidleLauenstein, Sarim Rothman, MD   levofloxacin (LEVAQUIN) 500 MG tablet Take 1 tablet (500 mg total) by mouth daily. 7 tablet Elvina SidleLauenstein, Kalem Rockwell, MD     Controlled Substance Prescriptions [place] Controlled Substance Registry consulted? Not Applicable   Elvina SidleLauenstein, Veronia Laprise, MD 07/07/18 [phone]

## 2018-07-07 NOTE — ED Triage Notes (Signed)
Pt is here today for a f/u of his cough from 07/03/18.

## 2018-07-07 NOTE — Discharge Instructions (Addendum)
I do not hear sounds of pneumonia today.  Instead, you appear to have bronchitis and the new medicine should work a little better.  Stop taking the doxycycline and start the new antibiotic along with prednisone.

## 2018-11-23 ENCOUNTER — Other Ambulatory Visit: Payer: Self-pay

## 2018-11-23 ENCOUNTER — Emergency Department
Admission: EM | Admit: 2018-11-23 | Discharge: 2018-11-23 | Disposition: A | Discharge status: Home / Self Care | Payer: 59 | Source: Home / Self Care | Attending: Family Medicine | Admitting: Family Medicine

## 2018-11-23 ENCOUNTER — Encounter: Payer: Self-pay | Admitting: *Deleted

## 2018-11-23 DIAGNOSIS — J9801 Acute bronchospasm: Secondary | ICD-10-CM

## 2018-11-23 DIAGNOSIS — J069 Acute upper respiratory infection, unspecified: Secondary | ICD-10-CM

## 2018-11-23 DIAGNOSIS — B9789 Other viral agents as the cause of diseases classified elsewhere: Secondary | ICD-10-CM | POA: Diagnosis not present

## 2018-11-23 MED ORDER — METHYLPREDNISOLONE ACETATE 80 MG/ML IJ SUSP
80.0000 mg | Freq: Once | INTRAMUSCULAR | Status: AC
  Administered 2018-11-23: 13:00:00 80 mg via INTRAMUSCULAR

## 2018-11-23 MED ORDER — DOXYCYCLINE HYCLATE 100 MG PO CAPS: 100.0000 mg | ORAL_CAPSULE | Freq: Two times a day (BID) | ORAL | 0 refills | Status: DC

## 2018-11-23 MED ORDER — GUAIFENESIN-CODEINE 100-10 MG/5ML PO SOLN: ORAL | 0 refills | Status: DC

## 2018-11-23 NOTE — ED Triage Notes (Signed)
Pt co prodcutive cough x 1 1/2 wk. Denies fever or travel. Per pt his PCP dx him with bronchitis last week; he was given flonase.

## 2018-11-23 NOTE — Discharge Instructions (Addendum)
Take plain guaifenesin (1200mg  extended release tabs such as Mucinex) twice daily, with plenty of water, for cough and congestion.  May add Pseudoephedrine (30mg , one or two every 4 to 6 hours) for sinus congestion.  Get adequate rest.   May use Afrin nasal spray (or generic oxymetazoline) each morning for about 5 days and then discontinue.  Also recommend using saline nasal spray several times daily and saline nasal irrigation (AYR is a common brand).  Use Flonase nasal spray each morning after using Afrin nasal spray and saline nasal irrigation. Continue albuterol inhaler as needed.  Resume Symbicort inhaler, 2 puffs twice daily. Stop all antihistamines for now, and other non-prescription cough/cold preparations.   If symptoms become significantly worse during the night or over the weekend, call local emergency room.

## 2018-11-23 NOTE — ED Provider Notes (Signed)
[facility]    CSN: [id number] Arrival date & time: 11/23/18  1142     History   Chief Complaint Chief Complaint  Patient presents with  . Cough    HPI James Martinez is a 53 y.o. male.   About 1.5 weeks ago patient developed typical cold-like symptoms developing over several days, including mild sore throat, sinus congestion, chills, fatigue, and cough.  He visited his PCP about one week ago and diagnosed with URI/bronchitis.  He was administered Depomedrol 80mg .  The patient improved initially, but has developed increasing tightness in his anterior chest, wheezing and shortness of breath with activity.  He denies pleuritic pain and fevers, chills, and sweats presently. He has asthma and is presently using an albuterol MDI.  He also has Symbicort, but is not using it presently.  He has perennial rhinitis, worse in the spring.  He had pneumonia last year, but does not feel as he did then.  The history is provided by the patient.    Past Medical History:  Diagnosis Date  . Esophageal abnormality    spasm  . GERD (gastroesophageal reflux disease)   . History of knee problem   . Hyperlipidemia     There are no active problems to display for this patient.   Past Surgical History:  Procedure Laterality Date  . CHOLECYSTECTOMY    . KNEE ARTHROSCOPY W/ ACL RECONSTRUCTION         Home Medications    Prior to Admission medications   Medication Sig Start Date End Date Taking? Authorizing Provider  cholestyramine (QUESTRAN) 4 g packet Take by mouth. 10/30/18  Yes [provider]  fluticasone (FLONASE) 50 MCG/ACT nasal spray Place into the nose. 11/16/18 11/16/19 Yes [provider]  albuterol (PROVENTIL HFA;VENTOLIN HFA) 108 (90 Base) MCG/ACT inhaler Inhale 1-2 puffs into the lungs every 6 (six) hours as needed for wheezing or shortness of breath. 07/03/18   Lurene ShadowPhelps, Erin O, PA-C  aspirin 81 MG chewable tablet Chew by mouth daily.    [provider]  doxycycline (VIBRAMYCIN) 100 MG capsule Take 1 capsule (100 mg total) by mouth 2 (two) times daily. Take with food. 11/23/18   Lattie HawBeese, Stephen A, MD  guaiFENesin-codeine 100-10 MG/5ML syrup Take 10mL by mouth at bedtime as needed for cough.  May repeat dose in 4 to 6 hours. 11/23/18   Lattie HawBeese, Stephen A, MD  omeprazole (PRILOSEC) 10 MG capsule Take 10 mg by mouth daily.    [provider]    Family History Family History  Problem Relation Age of Onset  . Cancer Father   . Heart failure Father     Social History Social History   Tobacco Use  . Smoking status: Never Smoker  . Smokeless tobacco: Never Used  Substance Use Topics  . Alcohol use: Not Currently    Frequency: Never    Comment: quit 08/2017  . Drug use: Never     Allergies   Penicillins and Prednisone   Review of Systems Review of Systems + sore throat, resolved + cough No pleuritic pain but feels tight in anterior chest + wheezing + nasal congestion + post-nasal drainage No sinus pain/pressure No itchy/red eyes ? earache No hemoptysis + SOB with activity No fever, + chills resolved No nausea No vomiting No abdominal pain No diarrhea No urinary symptoms No skin rash + fatigue + myalgias resolved + headache Used OTC meds without relief   Physical Exam Triage Vital Signs ED Triage  Vitals  Enc Vitals Group     BP 11/23/18 1209 134/88     Pulse Rate 11/23/18 1209 89     Resp 11/23/18 1209 18     Temp 11/23/18 1209 98.6 F (37 C)     Temp Source 11/23/18 1209 Oral     SpO2 11/23/18 1209 95 %     Weight 11/23/18 1210 245 lb (111.1 kg)     Height 11/23/18 1210  (1.676 m)     Head Circumference --      Peak Flow --      Pain Score 11/23/18 1210 0     Pain Loc --      Pain Edu? --      Excl. in GC? --    No data found.  Updated Vital Signs BP 134/88 (BP Location: Right Arm)   Pulse 89   Temp 98.6 F (37 C) (Oral)   Resp 18   Ht  (1.676 m)   Wt 111.1 kg    SpO2 95%   BMI 39.54 kg/m   Visual Acuity Right Eye Distance:   Left Eye Distance:   Bilateral Distance:    Right Eye Near:   Left Eye Near:    Bilateral Near:     Physical Exam Nursing notes and Vital Signs reviewed. Appearance:  Patient appears stated age, and in no acute distress Eyes:  Pupils are equal, round, and reactive to light and accomodation.  Extraocular movement is intact.  Conjunctivae are not inflamed  Ears:  Canals normal.  Tympanic membranes normal.  Nose:  Mildly congested turbinates.  No sinus tenderness.   Pharynx:  Normal Neck:  Supple.  Enlarged posterior/lateral nodes are palpated bilaterally, tender to palpation on the left.   Lungs:  Clear to auscultation.  Breath sounds are equal.  Moving air well. Heart:  Regular rate and rhythm without murmurs, rubs, or gallops.  Abdomen:  Nontender without masses or hepatosplenomegaly.  Bowel sounds are present.  No CVA or flank tenderness.  Extremities:  No edema.  Skin:  No rash present.    UC Treatments / Results  Labs (all labs ordered are listed, but only abnormal results are displayed) Labs Reviewed - No data to display  EKG None  Radiology No results found.  Procedures Procedures (including critical care time)  Medications Ordered in UC Medications  methylPREDNISolone acetate (DEPO-MEDROL) injection 80 mg (has no administration in time range)    Initial Impression / Assessment and Plan / UC Course  I have reviewed the triage vital signs and the nursing notes.  Pertinent labs & imaging results that were available during my care of the patient were reviewed by me and considered in my medical decision making (see chart for details).    Administered Depo Medrol  IM  Because of his past history of pneumonia, will begin empiric doxycycline  BID for atypical coverage. Rx for Robitussin AC for night time cough.  Controlled Substance Prescriptions I have consulted the [place] Controlled Substances  Registry for this patient, and feel the risk/benefit ratio today is favorable for proceeding with this prescription for a controlled substance.  Followup with Family Doctor if not improved in about 10 days. Recommend discussing an Asthma Plan with his PCP   Final Clinical Impressions(s) / UC Diagnoses   Final diagnoses:  Viral URI with cough  Bronchospasm     Discharge Instructions     Take plain guaifenesin (  extended release tabs such as Mucinex) twice daily,  with plenty of water, for cough and congestion.  May add Pseudoephedrine (30mg , one or two every 4 to 6 hours) for sinus congestion.  Get adequate rest.   May use Afrin nasal spray (or generic oxymetazoline) each morning for about 5 days and then discontinue.  Also recommend using saline nasal spray several times daily and saline nasal irrigation (AYR is a common brand).  Use Flonase nasal spray each morning after using Afrin nasal spray and saline nasal irrigation. Continue albuterol inhaler as needed.  Resume Symbicort inhaler, 2 puffs twice daily. Stop all antihistamines for now, and other non-prescription cough/cold preparations.   If symptoms become significantly worse during the night or over the weekend, call local emergency room.     ED Prescriptions    Medication Sig Dispense Auth. Provider   doxycycline (VIBRAMYCIN) 100 MG capsule Take 1 capsule (100 mg total) by mouth 2 (two) times daily. Take with food. 20 capsule Lattie Haw, MD   guaiFENesin-codeine 100-10 MG/5ML syrup Take 53mL by mouth at bedtime as needed for cough.  May repeat dose in 4 to 6 hours. 100 mL Lattie Haw, MD         Lattie Haw, MD 11/23/18 [phone]

## 2019-01-20 ENCOUNTER — Other Ambulatory Visit: Payer: Self-pay

## 2019-01-20 ENCOUNTER — Encounter: Payer: Self-pay | Admitting: Emergency Medicine

## 2019-01-20 ENCOUNTER — Emergency Department
Admission: EM | Admit: 2019-01-20 | Discharge: 2019-01-20 | Disposition: A | Discharge status: Home / Self Care | Payer: 59 | Source: Home / Self Care

## 2019-01-20 DIAGNOSIS — R053 Chronic cough: Secondary | ICD-10-CM

## 2019-01-20 DIAGNOSIS — R05 Cough: Secondary | ICD-10-CM

## 2019-01-20 MED ORDER — IPRATROPIUM BROMIDE 0.06 % NA SOLN: 2.0000 | Freq: Four times a day (QID) | NASAL | 1 refills | Status: AC

## 2019-01-20 NOTE — Discharge Instructions (Addendum)
°  You may try the newly prescribed nasal spray up to 4 times daily to see if that helps with your cough and post-nasal drip.  It is highly recommended that you follow up with your family doctor to discuss being on a preventative inhaler to help with cough as albuterol typically is only used as a "rescue" inhaler for asthma flareups rather than daily use.  Call 911 or go to the hospital if symptoms get significantly worse- chest tightness, trouble breathing, no relief with inhaler, or other new concerning symptoms develop.

## 2019-01-20 NOTE — ED Triage Notes (Signed)
Productive cough started yesterday, states he has a cough year round, worse yesterday.

## 2019-01-20 NOTE — ED Provider Notes (Signed)
[facility]    CSN: [id number] Arrival date & time: 01/20/19  8657     History   Chief Complaint Chief Complaint  Patient presents with  . Cough    HPI James Martinez is a 53 y.o. male.   HPI James Martinez is a 53 y.o. male presenting to UC with request for a work note stating he is safe to return to work. Pt reports having a chronic cough that was worse yesterday so his work is requesting he be evaluated to make sure it is safe for him to return to work. Pt denies chest pain or SOB. Denies fever, chills, n/v/d.  Pt was seen at [facility] on 11/23/2018 and was tx with doxycycline and guaifenesin-codeine.  He states he does not feel like he ever got 100% better but cough has been gradually improving. He uses his albuterol inhaler 1-2 times daily.  He notes that is how his PCP instructed him to use it. He does not have any other preventative inhalers. He does take Zyrtec and uses Flonase with mild relief.  No known sick contacts or recent travel.     Past Medical History:  Diagnosis Date  . Esophageal abnormality    spasm  . GERD (gastroesophageal reflux disease)   . History of knee problem   . Hyperlipidemia     There are no active problems to display for this patient.   Past Surgical History:  Procedure Laterality Date  . CHOLECYSTECTOMY    . KNEE ARTHROSCOPY W/ ACL RECONSTRUCTION         Home Medications    Prior to Admission medications   Medication Sig Start Date End Date Taking? Authorizing Provider  albuterol (PROVENTIL HFA;VENTOLIN HFA) 108 (90 Base) MCG/ACT inhaler Inhale 1-2 puffs into the lungs every 6 (six) hours as needed for wheezing or shortness of breath. 07/03/18   Lurene Shadow, PA-C  cholestyramine (QUESTRAN) 4 g packet Take by mouth. 10/30/18   [provider]  fluticasone (FLONASE) 50 MCG/ACT nasal spray Place into the nose. 11/16/18 11/16/19  [provider]  ipratropium (ATROVENT) 0.06 % nasal spray Place 2 sprays into  both nostrils 4 (four) times daily. 01/20/19   Lurene Shadow, PA-C  omeprazole (PRILOSEC) 10 MG capsule Take 10 mg by mouth daily.    [provider]    Family History Family History  Problem Relation Age of Onset  . Cancer Father   . Heart failure Father     Social History Social History   Tobacco Use  . Smoking status: Never Smoker  . Smokeless tobacco: Never Used  Substance Use Topics  . Alcohol use: Not Currently    Frequency: Never    Comment: quit 08/2017  . Drug use: Never     Allergies   Penicillins and Prednisone   Review of Systems Review of Systems  Constitutional: Negative for chills and fever.  HENT: Positive for congestion and postnasal drip. Negative for ear pain, sore throat, trouble swallowing and voice change.   Respiratory: Positive for cough. Negative for chest tightness, shortness of breath and wheezing.   Cardiovascular: Negative for chest pain and palpitations.  Gastrointestinal: Negative for abdominal pain, diarrhea, nausea and vomiting.  Musculoskeletal: Negative for arthralgias, back pain and myalgias.  Skin: Negative for rash.     Physical Exam Triage Vital Signs ED Triage Vitals  Enc Vitals Group     BP 01/20/19 0852 135/84     Pulse Rate 01/20/19 0852 68  Resp --      Temp 01/20/19 0852 98.7 F (37.1 C)     Temp Source 01/20/19 0852 Oral     SpO2 01/20/19 0852 97 %     Weight 01/20/19 0853 239 lb (108.4 kg)     Height 01/20/19 0853 5\' 6"  (1.676 m)     Head Circumference --      Peak Flow --      Pain Score 01/20/19 0853 0     Pain Loc --      Pain Edu? --      Excl. in GC? --    No data found.  Updated Vital Signs BP 135/84 (BP Location: Right Arm)   Pulse 68   Temp 98.7 F (37.1 C) (Oral)   Ht 5\' 6"  (1.676 m)   Wt 239 lb (108.4 kg)   SpO2 97%   BMI 38.58 kg/m   Visual Acuity Right Eye Distance:   Left Eye Distance:   Bilateral Distance:    Right Eye Near:   Left Eye Near:    Bilateral Near:      Physical Exam Vitals signs and nursing note reviewed.  Constitutional:      Appearance: Normal appearance. He is well-developed.  HENT:     Head: Normocephalic and atraumatic.     Right Ear: Tympanic membrane normal.     Left Ear: Tympanic membrane normal.     Nose: Nose normal.     Mouth/Throat:     Lips: Pink.     Mouth: Mucous membranes are moist.     Pharynx: Oropharynx is clear. Uvula midline.  Neck:     Musculoskeletal: Normal range of motion and neck supple.  Cardiovascular:     Rate and Rhythm: Normal rate and regular rhythm.  Pulmonary:     Effort: Pulmonary effort is normal. No respiratory distress.     Breath sounds: Normal breath sounds. No stridor. No wheezing, rhonchi or rales.     Comments: Dry intermittent cough on exam Musculoskeletal: Normal range of motion.  Skin:    General: Skin is warm and dry.  Neurological:     Mental Status: He is alert and oriented to person, place, and time.  Psychiatric:        Behavior: Behavior normal.      UC Treatments / Results  Labs (all labs ordered are listed, but only abnormal results are displayed) Labs Reviewed - No data to display  EKG None  Radiology No results found.  Procedures Procedures (including critical care time)  Medications Ordered in UC Medications - No data to display  Initial Impression / Assessment and Plan / UC Course  I have reviewed the triage vital signs and the nursing notes.  Pertinent labs & imaging results that were available during my care of the patient were reviewed by me and considered in my medical decision making (see chart for details).    Medical records reviewed. Intermittent dry cough on exam. Vitals: unremarkable with normal temp and O2 of 97% on RA Lungs: CTAB Will have pt try ipatropium nasal spray for the post-nasal drip Encouraged to f/u with PCP to discuss daily preventative asthma inhalers rather than using his albuterol daily. AVS provided.  Final Clinical  Impressions(s) / UC Diagnoses   Final diagnoses:  Chronic cough     Discharge Instructions      You may try the newly prescribed nasal spray up to 4 times daily to see if that helps with your cough and  post-nasal drip.  It is highly recommended that you follow up with your family doctor to discuss being on a preventative inhaler to help with cough as albuterol typically is only used as a "rescue" inhaler for asthma flareups rather than daily use.  Call 911 or go to the hospital if symptoms get significantly worse- chest tightness, trouble breathing, no relief with inhaler, or other new concerning symptoms develop.     ED Prescriptions    Medication Sig Dispense Auth. Provider   ipratropium (ATROVENT) 0.06 % nasal spray Place 2 sprays into both nostrils 4 (four) times daily. 15 mL Lurene Shadow, PA-C     Controlled Substance Prescriptions [place] Controlled Substance Registry consulted? Not Applicable   Rolla Plate 01/20/19 3710

## 2019-08-16 IMAGING — DX DG CERVICAL SPINE COMPLETE 4+V
7 series · 7 of 7 positions shown · non-contrast
Comparison: None.

CLINICAL DATA: Chronic neck pain without injury. Bilateral
radiculopathy.

EXAM:
CERVICAL SPINE - COMPLETE 4+ VIEW

[c-spine lat]
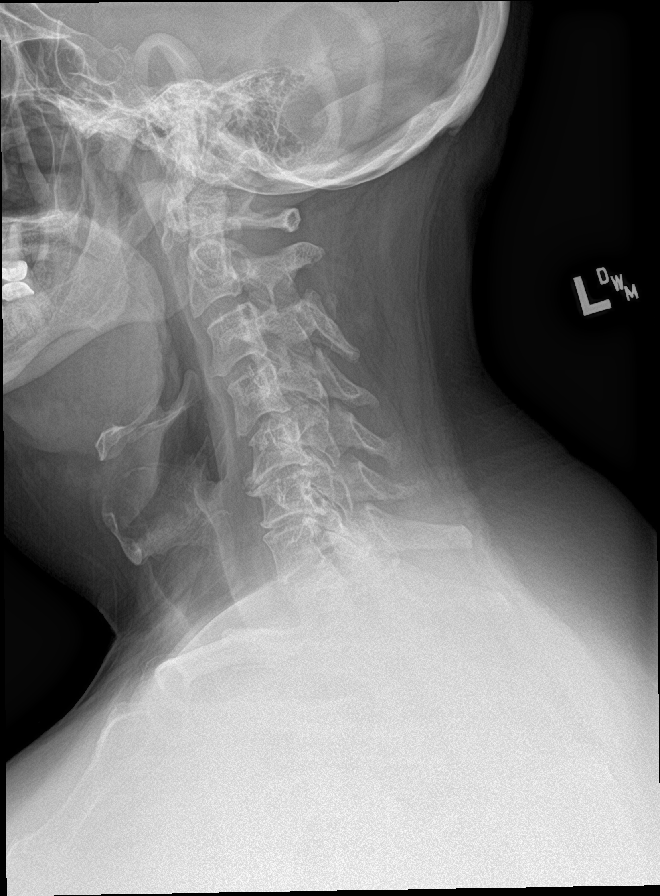

[c-spine obl (1 of 2)]
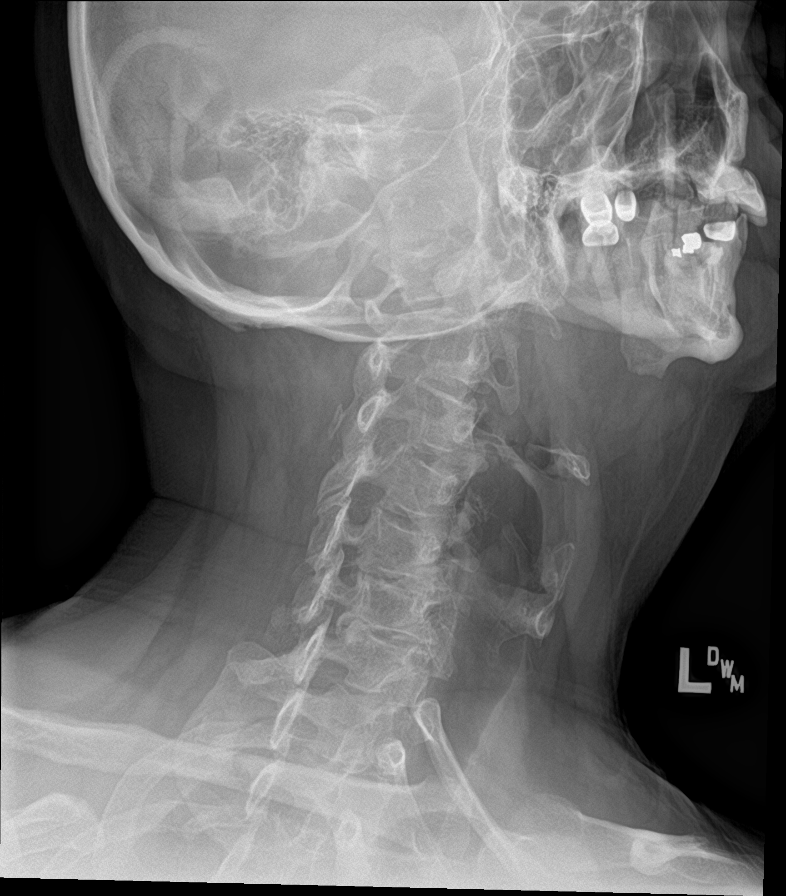

[c-spine obl (2 of 2)]
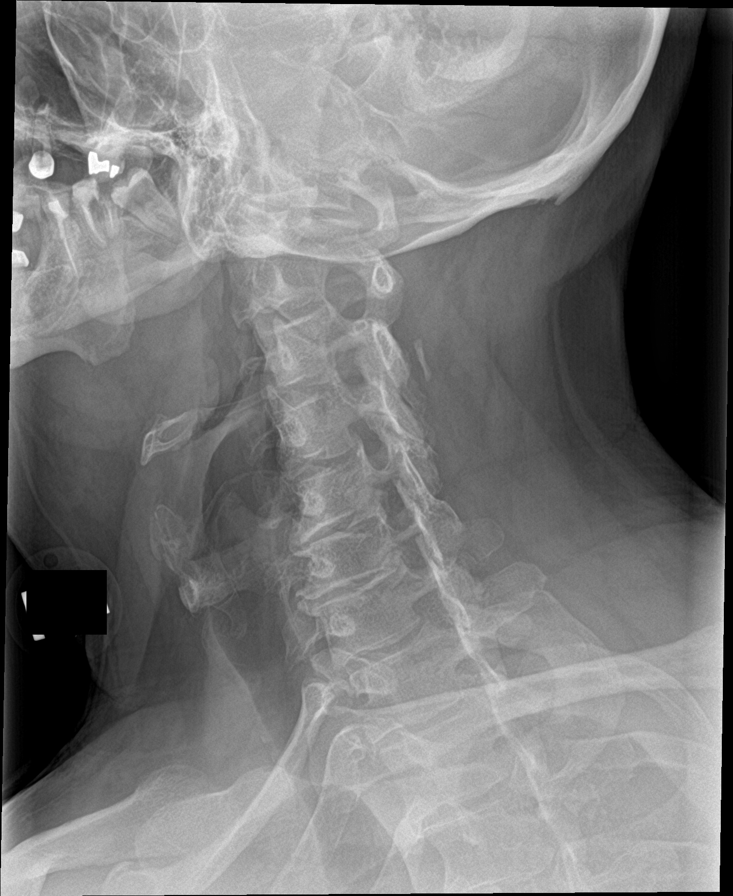

[c-spine ap (1 of 2)]
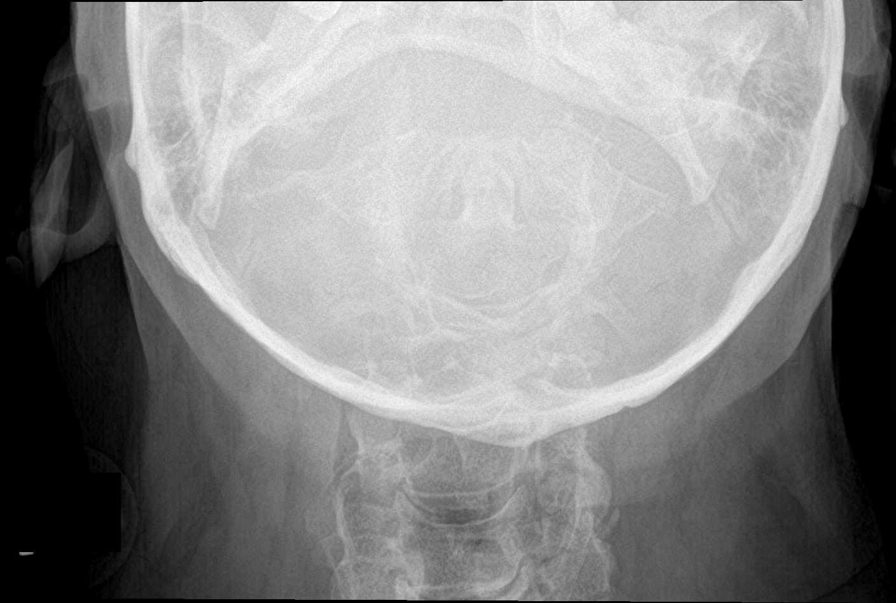

[c-spine open mouth]
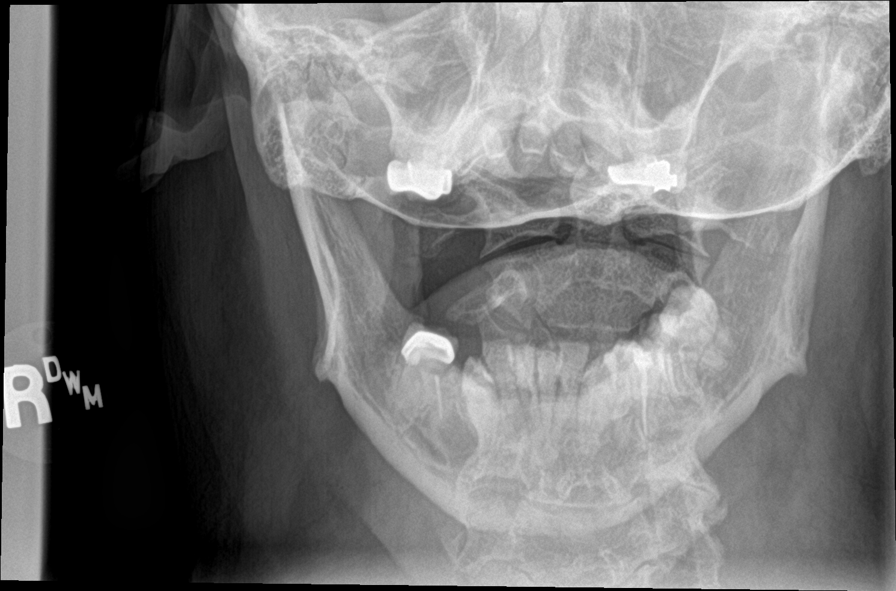

[c-spine swimmers]
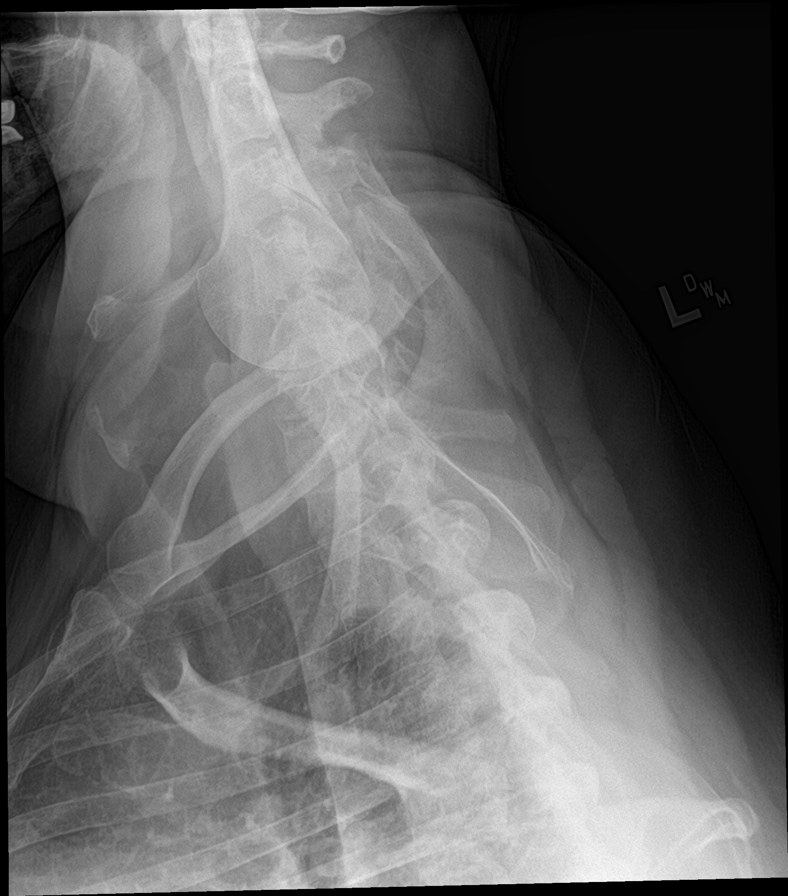

[c-spine ap (2 of 2)]
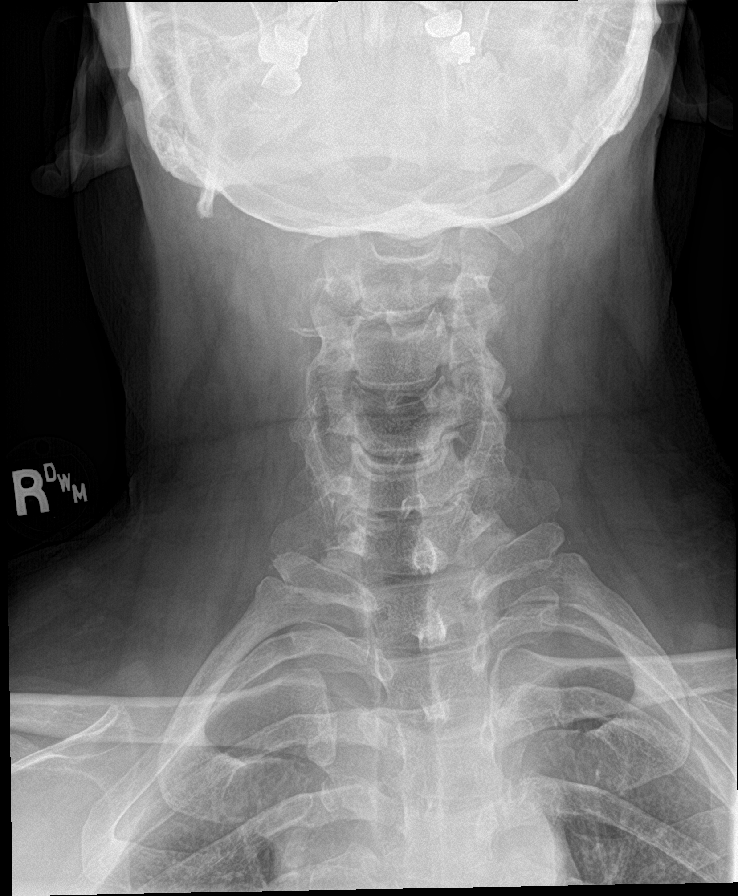

[7 of 7 positions shown; findings below may reference images not displayed]

FINDINGS: No fracture or spondylolisthesis is noted. Moderate degenerative
disc disease is noted at C5-6 and C6-7. Mild bilateral neural
foraminal stenosis is noted at C5-6 and C6-7 secondary to
uncovertebral spurring.
IMPRESSION: Moderate degenerative disc disease is noted at C5-6 and C6-7 with
mild bilateral neural foraminal stenosis at these levels secondary
to uncovertebral spurring. No acute abnormality is noted in the
cervical spine.
